# Patient Record
Sex: Male | Born: 1995 | Race: Black or African American | Hispanic: No | Marital: Single
Health system: Southern US, Community
[De-identification: ages and names within clinical notes are randomized; demographics above are authoritative.]

---

## 2018-04-01 ENCOUNTER — Emergency Department (HOSPITAL_COMMUNITY)
Admission: EM | Admit: 2018-04-01 | Discharge: 2018-04-01 | Disposition: A | Discharge status: Home / Self Care | Payer: Self-pay | Attending: Emergency Medicine | Admitting: Emergency Medicine

## 2018-04-01 ENCOUNTER — Emergency Department (HOSPITAL_COMMUNITY): Payer: Self-pay

## 2018-04-01 ENCOUNTER — Encounter (HOSPITAL_COMMUNITY): Payer: Self-pay | Admitting: Emergency Medicine

## 2018-04-01 DIAGNOSIS — J069 Acute upper respiratory infection, unspecified: Secondary | ICD-10-CM | POA: Insufficient documentation

## 2018-04-01 DIAGNOSIS — B9789 Other viral agents as the cause of diseases classified elsewhere: Secondary | ICD-10-CM

## 2018-04-01 MED ORDER — IBUPROFEN 800 MG PO TABS: 800.0000 mg | ORAL_TABLET | Freq: Three times a day (TID) | ORAL | 0 refills | Status: AC | PRN

## 2018-04-01 MED ORDER — PROMETHAZINE-DM 6.25-15 MG/5ML PO SYRP: 5.0000 mL | ORAL_SOLUTION | Freq: Four times a day (QID) | ORAL | 0 refills | Status: AC | PRN

## 2018-04-01 MED ORDER — AEROCHAMBER PLUS FLO-VU LARGE MISC
Status: AC
  Filled 2018-04-01: qty 1

## 2018-04-01 MED ORDER — PREDNISONE 50 MG PO TABS: 50.0000 mg | ORAL_TABLET | Freq: Every day | ORAL | 0 refills | Status: AC

## 2018-04-01 MED ORDER — ALBUTEROL SULFATE HFA 108 (90 BASE) MCG/ACT IN AERS
2.0000 | INHALATION_SPRAY | RESPIRATORY_TRACT | Status: DC | PRN
  Administered 2018-04-01: 2 via RESPIRATORY_TRACT
  Filled 2018-04-01: qty 6.7

## 2018-04-01 MED ORDER — AEROCHAMBER PLUS FLO-VU LARGE MISC
1.0000 | Freq: Once | Status: AC
  Administered 2018-04-01: 1
  Filled 2018-04-01: qty 1

## 2018-04-01 MED ORDER — IPRATROPIUM-ALBUTEROL 0.5-2.5 (3) MG/3ML IN SOLN
3.0000 mL | Freq: Once | RESPIRATORY_TRACT | Status: AC
  Administered 2018-04-01: 3 mL via RESPIRATORY_TRACT
  Filled 2018-04-01: qty 3

## 2018-04-01 NOTE — ED Triage Notes (Signed)
Pt reports congestion and cough but no fever.  He reports SOB w/ exertion.  "feels like the common cold".

## 2018-04-01 NOTE — Discharge Instructions (Addendum)
Return here as needed.  Rest as much as possible.  Increase your fluid intake °

## 2018-04-01 NOTE — ED Notes (Signed)
Patient transported to X-ray 

## 2018-04-01 NOTE — ED Provider Notes (Signed)
[facility] EMERGENCY DEPARTMENT Provider Note   CSN: [id number] Arrival date & time: 04/01/18  0128    History   Chief Complaint Chief Complaint  Patient presents with  . Cough  . flu like symptoms    HPI Michael Valentine is a 23 y.o. male.     HPI Patient presents to the emergency department with cough, sore throat and nasal congestion over the last 2 weeks.  The patient states that he seems to somewhat better but then seems to get worse again.  Patient states he has been taking some over-the-counter medications with some relief.  The patient denies chest pain, shortness of breath, headache,blurred vision, neck pain, fever, weakness, numbness, dizziness, anorexia, edema, abdominal pain, nausea, vomiting, diarrhea, rash, back pain, dysuria, hematemesis, bloody stool, near syncope, or syncope. History reviewed. No pertinent past medical history.  There are no active problems to display for this patient.   History reviewed. No pertinent surgical history.      Home Medications    Prior to Admission medications   Not on File    Family History No family history on file.  Social History Social History   Tobacco Use  . Smoking status: Not on file  Substance Use Topics  . Alcohol use: Not on file  . Drug use: Not on file     Allergies   Patient has no known allergies.   Review of Systems Review of Systems All other systems negative except as documented in the HPI. All pertinent positives and negatives as reviewed in the HPI.   Physical Exam Updated Vital Signs BP 134/74   Pulse 87   Temp 98 F (36.7 C) (Oral)   Resp 16   Ht 5\' 10"  (1.778 m)   Wt 93 kg   SpO2 100%   BMI 29.41 kg/m   Physical Exam Vitals signs and nursing note reviewed.  Constitutional:      General: He is not in acute distress.    Appearance: He is well-developed.  HENT:     Head: Normocephalic and atraumatic.  Eyes:     Pupils: Pupils are equal, round,  and reactive to light.  Neck:     Musculoskeletal: Normal range of motion and neck supple.  Cardiovascular:     Rate and Rhythm: Normal rate and regular rhythm.     Heart sounds: Normal heart sounds. No murmur. No friction rub. No gallop.   Pulmonary:     Effort: Pulmonary effort is normal. No respiratory distress.     Breath sounds: Normal breath sounds. No wheezing, rhonchi or rales.  Skin:    General: Skin is warm and dry.     Capillary Refill: Capillary refill takes less than 2 seconds.     Findings: No erythema or rash.  Neurological:     Mental Status: He is alert and oriented to person, place, and time.     Motor: No abnormal muscle tone.     Coordination: Coordination normal.  Psychiatric:        Behavior: Behavior normal.      ED Treatments / Results  Labs (all labs ordered are listed, but only abnormal results are displayed) Labs Reviewed - No data to display  EKG None  Radiology Dg Chest 2 View  Result Date: 04/01/2018 CLINICAL DATA:  Pt reports congestion and cough but no fever. He reports SOB w/ exertion. "feels like the common cold". EXAM: CHEST - 2 VIEW COMPARISON:  None. FINDINGS: The heart size  and mediastinal contours are within normal limits. Both lungs are clear. No pleural effusion or pneumothorax. The visualized skeletal structures are unremarkable. IMPRESSION: No active cardiopulmonary disease. Electronically Signed   By: Amie Portland M.D.   On: 04/01/2018 02:46    Procedures Procedures (including critical care time)  Medications Ordered in ED Medications  ipratropium-albuterol (DUONEB) 0.5-2.5 (3) MG/3ML nebulizer solution 3 mL (3 mLs Nebulization Given 04/01/18 0323)     Initial Impression / Assessment and Plan / ED Course  I have reviewed the triage vital signs and the nursing notes.  Pertinent labs & imaging results that were available during my care of the patient were reviewed by me and considered in my medical decision making (see chart  for details).       Patient has a viral URI with cough.  Should be treated symptomatically for this.  Patient is advised to return here as needed.  Patient's vital signs remained stable here in the emergency department.  Patient is having no shortness of breath or chest pain at this time.   Final Clinical Impressions(s) / ED Diagnoses   Final diagnoses:  None    ED Discharge Orders    None       Charlestine Night, PA-C 04/01/18 0355    Shaune Pollack, MD 04/01/18 [phone]

## 2019-11-11 IMAGING — DX CHEST - 2 VIEW
2 series · 2 of 2 positions shown · non-contrast
Comparison: None.

CLINICAL DATA: Pt reports congestion and cough but no fever. He
reports SOB w/ exertion. "feels like the common cold".

EXAM:
CHEST - 2 VIEW

[chest pa]
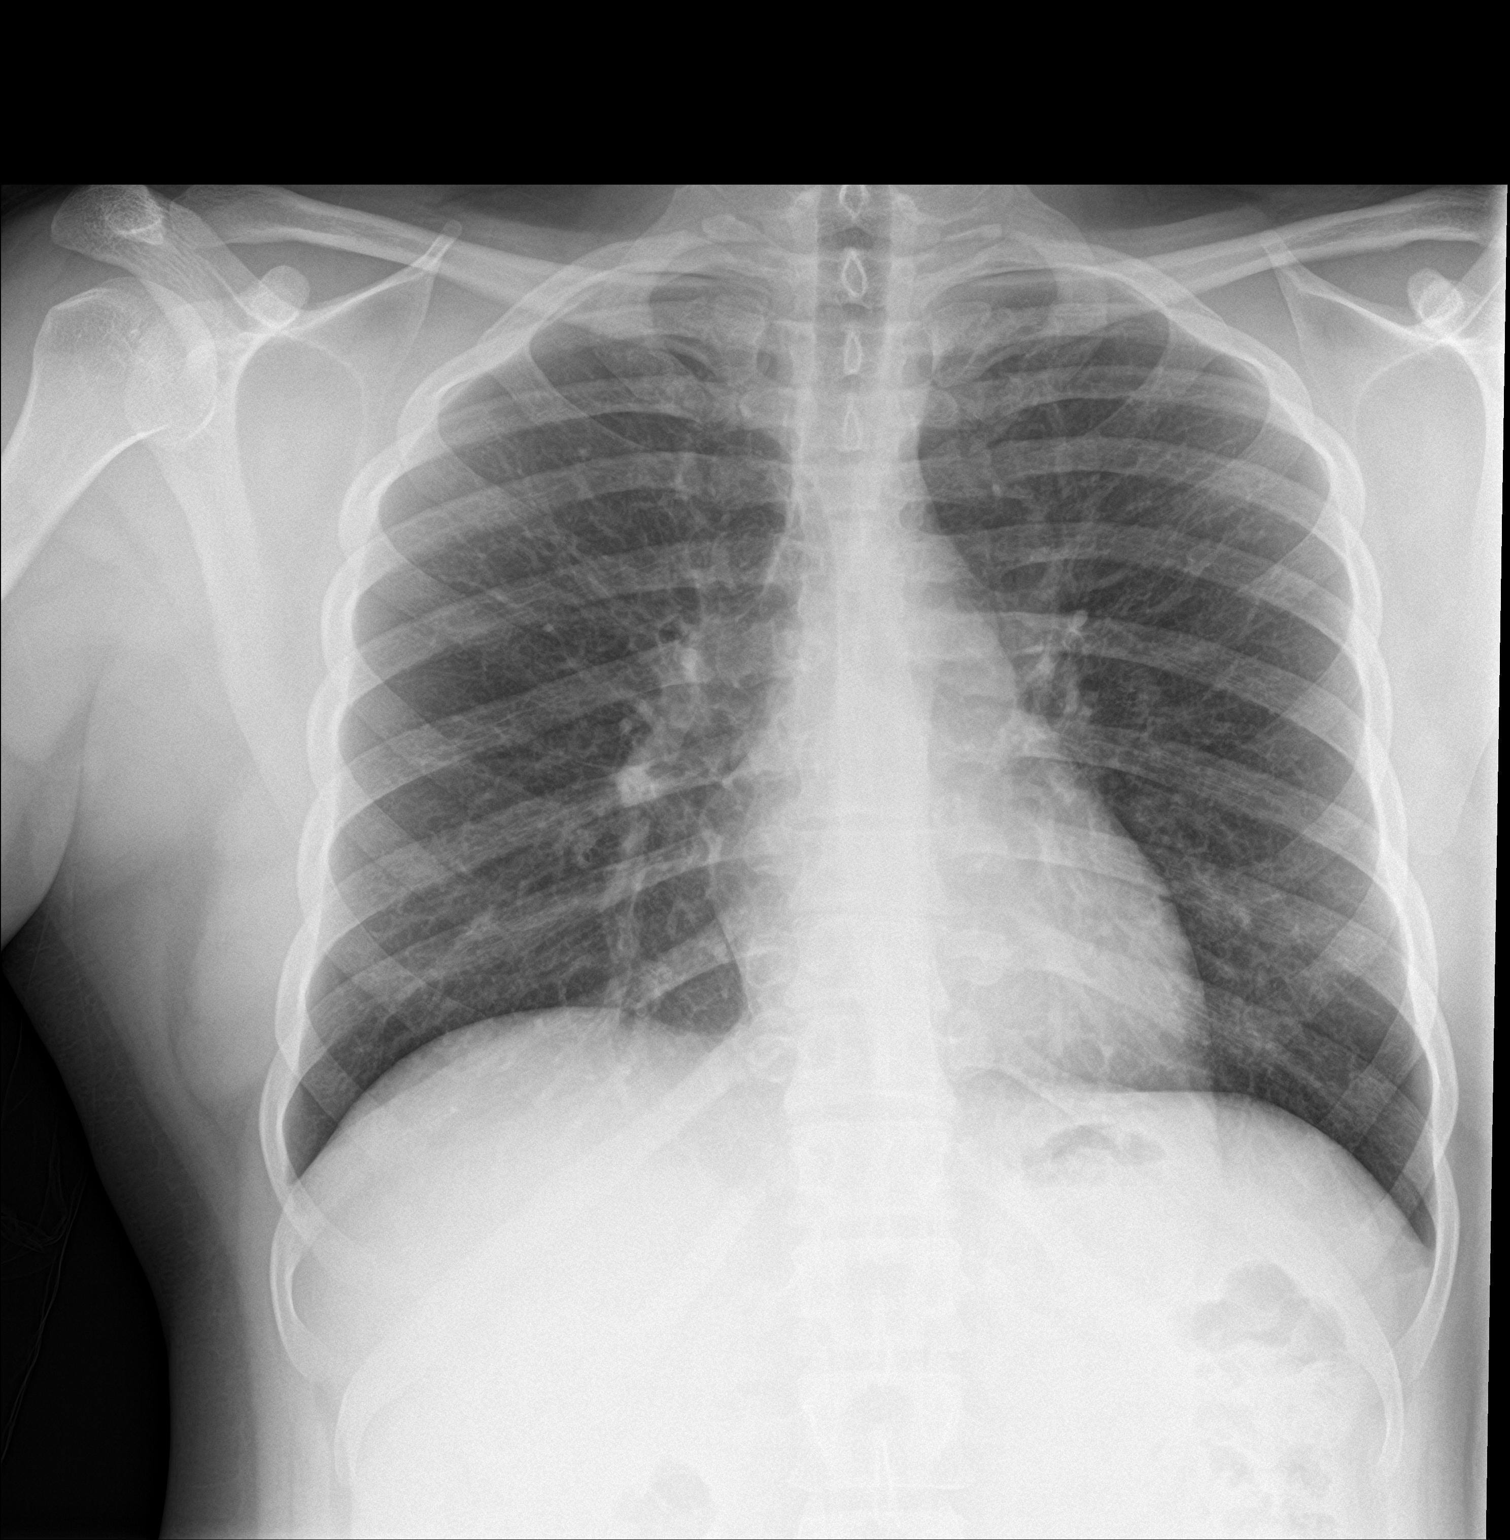

[chest lat]
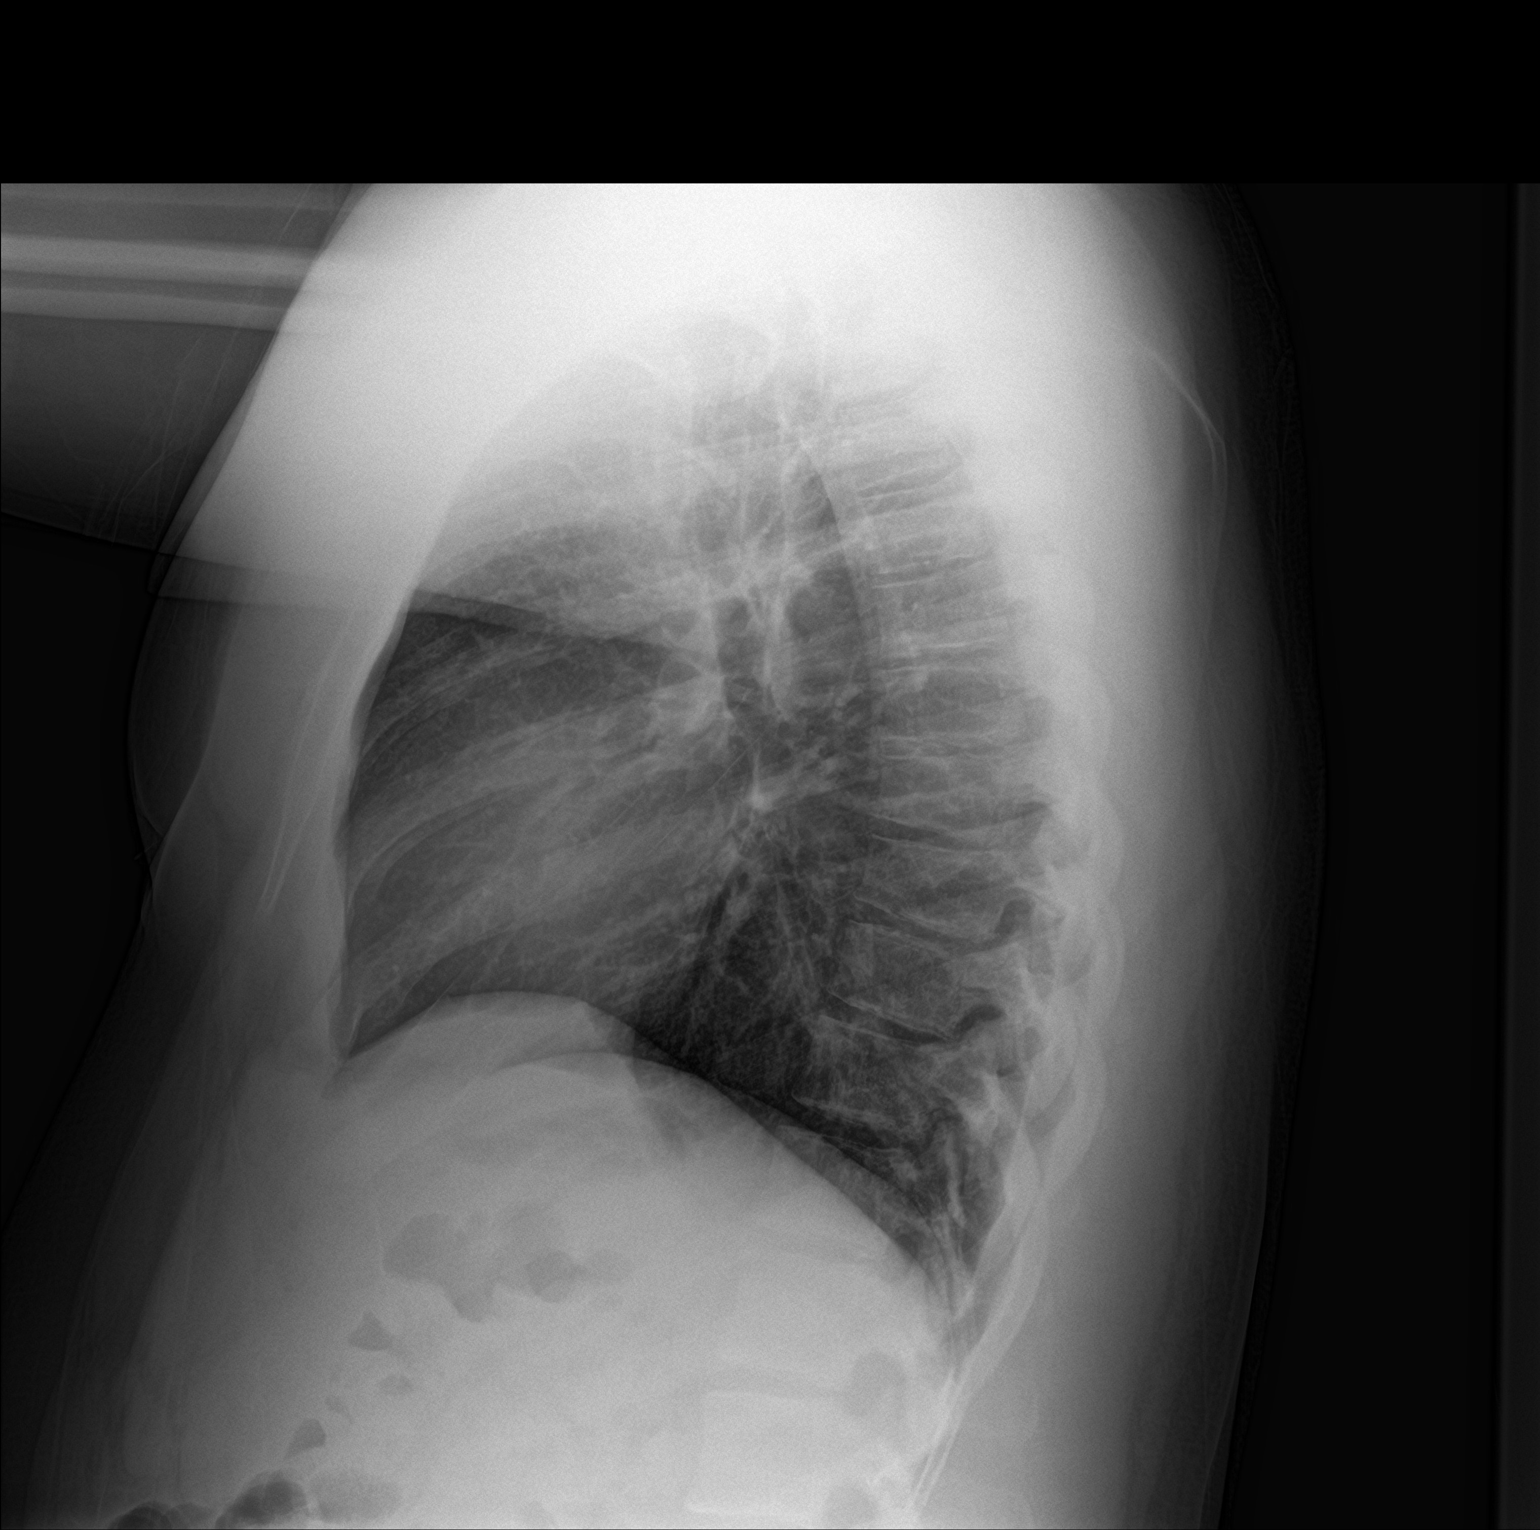

[2 of 2 positions shown; findings below may reference images not displayed]

FINDINGS: The heart size and mediastinal contours are within normal limits.
Both lungs are clear. No pleural effusion or pneumothorax. The
visualized skeletal structures are unremarkable.
IMPRESSION: No active cardiopulmonary disease.
